# Patient Record
Sex: Female | Born: 1955 | Race: White | Hispanic: No | Marital: Married | Smoking: Former smoker
Health system: Southern US, Community
[De-identification: ages and names within clinical notes are randomized; demographics above are authoritative.]

## PROBLEM LIST (undated history)

## (undated) DIAGNOSIS — R112 Nausea with vomiting, unspecified: Secondary | ICD-10-CM

## (undated) DIAGNOSIS — T4145XA Adverse effect of unspecified anesthetic, initial encounter: Secondary | ICD-10-CM

## (undated) DIAGNOSIS — Z9889 Other specified postprocedural states: Secondary | ICD-10-CM

## (undated) DIAGNOSIS — J302 Other seasonal allergic rhinitis: Secondary | ICD-10-CM

## (undated) DIAGNOSIS — M1990 Unspecified osteoarthritis, unspecified site: Secondary | ICD-10-CM

## (undated) DIAGNOSIS — T8859XA Other complications of anesthesia, initial encounter: Secondary | ICD-10-CM

## (undated) HISTORY — PX: TONSILLECTOMY: SUR1361

## (undated) HISTORY — PX: FINGER TENDON REPAIR: SHX1640

## (undated) HISTORY — PX: BROW LIFT: SHX178

## (undated) HISTORY — PX: FRACTURE SURGERY: SHX138

## (undated) HISTORY — PX: CHOLECYSTECTOMY: SHX55

---

## 2011-04-25 ENCOUNTER — Encounter (HOSPITAL_BASED_OUTPATIENT_CLINIC_OR_DEPARTMENT_OTHER): Payer: Self-pay | Admitting: *Deleted

## 2011-04-25 ENCOUNTER — Other Ambulatory Visit: Payer: Self-pay | Admitting: Orthopedic Surgery

## 2011-05-01 ENCOUNTER — Encounter (HOSPITAL_BASED_OUTPATIENT_CLINIC_OR_DEPARTMENT_OTHER): Payer: Self-pay | Admitting: *Deleted

## 2011-05-01 ENCOUNTER — Ambulatory Visit (HOSPITAL_BASED_OUTPATIENT_CLINIC_OR_DEPARTMENT_OTHER): Payer: BC Managed Care – PPO | Admitting: Anesthesiology

## 2011-05-01 ENCOUNTER — Encounter (HOSPITAL_BASED_OUTPATIENT_CLINIC_OR_DEPARTMENT_OTHER)
Admission: RE | Disposition: A | Discharge status: Home / Self Care | Payer: Self-pay | Source: Ambulatory Visit | Attending: Orthopedic Surgery

## 2011-05-01 ENCOUNTER — Ambulatory Visit (HOSPITAL_BASED_OUTPATIENT_CLINIC_OR_DEPARTMENT_OTHER)
Admission: RE | Admit: 2011-05-01 | Discharge: 2011-05-01 | Disposition: A | Discharge status: Home / Self Care | Payer: BC Managed Care – PPO | Source: Ambulatory Visit | Attending: Orthopedic Surgery | Admitting: Orthopedic Surgery

## 2011-05-01 ENCOUNTER — Encounter (HOSPITAL_BASED_OUTPATIENT_CLINIC_OR_DEPARTMENT_OTHER): Payer: Self-pay | Admitting: Anesthesiology

## 2011-05-01 DIAGNOSIS — S60459A Superficial foreign body of unspecified finger, initial encounter: Secondary | ICD-10-CM | POA: Insufficient documentation

## 2011-05-01 DIAGNOSIS — X58XXXA Exposure to other specified factors, initial encounter: Secondary | ICD-10-CM | POA: Insufficient documentation

## 2011-05-01 DIAGNOSIS — Y929 Unspecified place or not applicable: Secondary | ICD-10-CM | POA: Insufficient documentation

## 2011-05-01 HISTORY — DX: Unspecified osteoarthritis, unspecified site: M19.90

## 2011-05-01 HISTORY — DX: Adverse effect of unspecified anesthetic, initial encounter: T41.45XA

## 2011-05-01 HISTORY — DX: Other seasonal allergic rhinitis: J30.2

## 2011-05-01 HISTORY — PX: MASS EXCISION: SHX2000

## 2011-05-01 HISTORY — DX: Other specified postprocedural states: Z98.890

## 2011-05-01 HISTORY — DX: Nausea with vomiting, unspecified: R11.2

## 2011-05-01 HISTORY — DX: Other complications of anesthesia, initial encounter: T88.59XA

## 2011-05-01 LAB — POCT HEMOGLOBIN-HEMACUE: Hemoglobin: 13.5 g/dL (ref 12.0–15.0)

## 2011-05-01 SURGERY — EXCISION MASS
Anesthesia: General | Site: Finger | Laterality: Left | Wound class: Clean

## 2011-05-01 MED ORDER — EPHEDRINE SULFATE 50 MG/ML IJ SOLN
INTRAMUSCULAR | Status: DC | PRN
  Administered 2011-05-01: 10 mg via INTRAVENOUS

## 2011-05-01 MED ORDER — ONDANSETRON HCL 4 MG/2ML IJ SOLN
INTRAMUSCULAR | Status: DC | PRN
  Administered 2011-05-01: 4 mg via INTRAVENOUS

## 2011-05-01 MED ORDER — CHLORHEXIDINE GLUCONATE 4 % EX LIQD: 60.0000 mL | Freq: Once | CUTANEOUS | Status: DC

## 2011-05-01 MED ORDER — HYDROMORPHONE HCL PF 1 MG/ML IJ SOLN: 0.2500 mg | INTRAMUSCULAR | Status: DC | PRN

## 2011-05-01 MED ORDER — ONDANSETRON HCL 4 MG/2ML IJ SOLN: 4.0000 mg | Freq: Four times a day (QID) | INTRAMUSCULAR | Status: DC | PRN

## 2011-05-01 MED ORDER — LIDOCAINE HCL (CARDIAC) 20 MG/ML IV SOLN
INTRAVENOUS | Status: DC | PRN
  Administered 2011-05-01: 60 mg via INTRAVENOUS

## 2011-05-01 MED ORDER — PROPOFOL 10 MG/ML IV EMUL
INTRAVENOUS | Status: DC | PRN
  Administered 2011-05-01: 200 mg via INTRAVENOUS

## 2011-05-01 MED ORDER — FENTANYL CITRATE 0.05 MG/ML IJ SOLN
INTRAMUSCULAR | Status: DC | PRN
  Administered 2011-05-01: 50 ug via INTRAVENOUS
  Administered 2011-05-01: 25 ug via INTRAVENOUS
  Administered 2011-05-01: 50 ug via INTRAVENOUS

## 2011-05-01 MED ORDER — LACTATED RINGERS IV SOLN
INTRAVENOUS | Status: DC
  Administered 2011-05-01: 09:00:00 via INTRAVENOUS

## 2011-05-01 MED ORDER — DEXAMETHASONE SODIUM PHOSPHATE 10 MG/ML IJ SOLN
INTRAMUSCULAR | Status: DC | PRN
  Administered 2011-05-01: 10 mg via INTRAVENOUS

## 2011-05-01 MED ORDER — HYDROCODONE-ACETAMINOPHEN 5-325 MG PO TABS: ORAL_TABLET | ORAL | Status: AC

## 2011-05-01 MED ORDER — BUPIVACAINE HCL (PF) 0.25 % IJ SOLN
INTRAMUSCULAR | Status: DC | PRN
  Administered 2011-05-01: 10 mL

## 2011-05-01 MED ORDER — MIDAZOLAM HCL 5 MG/5ML IJ SOLN
INTRAMUSCULAR | Status: DC | PRN
  Administered 2011-05-01: 2 mg via INTRAVENOUS

## 2011-05-01 MED ORDER — CEFAZOLIN SODIUM 1-5 GM-% IV SOLN
1.0000 g | INTRAVENOUS | Status: AC
  Administered 2011-05-01: 1 g via INTRAVENOUS

## 2011-05-01 SURGICAL SUPPLY — 49 items
BANDAGE COBAN STERILE 2 (GAUZE/BANDAGES/DRESSINGS) IMPLANT
BANDAGE CONFORM 2  STR LF (GAUZE/BANDAGES/DRESSINGS) IMPLANT
BANDAGE ELASTIC 3 VELCRO ST LF (GAUZE/BANDAGES/DRESSINGS) IMPLANT
BANDAGE GAUZE 4  KLING STR (GAUZE/BANDAGES/DRESSINGS) IMPLANT
BANDAGE GAUZE ELAST BULKY 4 IN (GAUZE/BANDAGES/DRESSINGS) IMPLANT
BANDAGE GAUZE STRT 1 STR LF (GAUZE/BANDAGES/DRESSINGS) IMPLANT
BENZOIN TINCTURE PRP APPL 2/3 (GAUZE/BANDAGES/DRESSINGS) IMPLANT
BLADE MINI RND TIP GREEN BEAV (BLADE) IMPLANT
BLADE SURG 15 STRL LF DISP TIS (BLADE) ×2 IMPLANT
BLADE SURG 15 STRL SS (BLADE) ×2
BNDG COHESIVE 1X5 TAN STRL LF (GAUZE/BANDAGES/DRESSINGS) ×2 IMPLANT
BNDG ELASTIC 2 VLCR STRL LF (GAUZE/BANDAGES/DRESSINGS) IMPLANT
BNDG ESMARK 4X9 LF (GAUZE/BANDAGES/DRESSINGS) IMPLANT
BNDG PLASTER X FAST 3X3 WHT LF (CAST SUPPLIES) IMPLANT
CHLORAPREP W/TINT 26ML (MISCELLANEOUS) ×2 IMPLANT
CLOTH BEACON ORANGE TIMEOUT ST (SAFETY) ×2 IMPLANT
CORDS BIPOLAR (ELECTRODE) ×2 IMPLANT
COVER MAYO STAND STRL (DRAPES) ×2 IMPLANT
COVER TABLE BACK 60X90 (DRAPES) ×2 IMPLANT
CUFF TOURNIQUET SINGLE 18IN (TOURNIQUET CUFF) ×2 IMPLANT
DRAPE EXTREMITY T 121X128X90 (DRAPE) ×2 IMPLANT
DRAPE SURG 17X23 STRL (DRAPES) ×2 IMPLANT
GAUZE XEROFORM 1X8 LF (GAUZE/BANDAGES/DRESSINGS) ×2 IMPLANT
GLOVE BIO SURGEON STRL SZ7.5 (GLOVE) IMPLANT
GLOVE BIOGEL PI IND STRL 6.5 (GLOVE) ×1 IMPLANT
GLOVE BIOGEL PI IND STRL 8 (GLOVE) ×1 IMPLANT
GLOVE BIOGEL PI INDICATOR 6.5 (GLOVE) ×1
GLOVE BIOGEL PI INDICATOR 8 (GLOVE) ×1
GOWN PREVENTION PLUS XLARGE (GOWN DISPOSABLE) ×2 IMPLANT
GOWN STRL REIN XL XLG (GOWN DISPOSABLE) ×2 IMPLANT
NEEDLE HYPO 25X1 1.5 SAFETY (NEEDLE) ×2 IMPLANT
NS IRRIG 1000ML POUR BTL (IV SOLUTION) ×2 IMPLANT
PACK BASIN DAY SURGERY FS (CUSTOM PROCEDURE TRAY) ×2 IMPLANT
PAD CAST 3X4 CTTN HI CHSV (CAST SUPPLIES) IMPLANT
PAD CAST 4YDX4 CTTN HI CHSV (CAST SUPPLIES) IMPLANT
PADDING CAST ABS 4INX4YD NS (CAST SUPPLIES) ×1
PADDING CAST ABS COTTON 4X4 ST (CAST SUPPLIES) ×1 IMPLANT
PADDING CAST COTTON 3X4 STRL (CAST SUPPLIES)
PADDING CAST COTTON 4X4 STRL (CAST SUPPLIES)
SPONGE GAUZE 4X4 12PLY (GAUZE/BANDAGES/DRESSINGS) ×2 IMPLANT
STOCKINETTE 4X48 STRL (DRAPES) ×2 IMPLANT
STRIP CLOSURE SKIN 1/2X4 (GAUZE/BANDAGES/DRESSINGS) ×2 IMPLANT
SUT ETHILON 3 0 PS 1 (SUTURE) IMPLANT
SUT ETHILON 4 0 PS 2 18 (SUTURE) ×2 IMPLANT
SYR BULB 3OZ (MISCELLANEOUS) ×2 IMPLANT
SYR CONTROL 10ML LL (SYRINGE) ×2 IMPLANT
TOWEL OR 17X24 6PK STRL BLUE (TOWEL DISPOSABLE) ×4 IMPLANT
UNDERPAD 30X30 INCONTINENT (UNDERPADS AND DIAPERS) ×2 IMPLANT
WATER STERILE IRR 1000ML POUR (IV SOLUTION) ×2 IMPLANT

## 2011-05-01 NOTE — Op Note (Signed)
Dictation [phone]

## 2011-05-01 NOTE — Anesthesia Preprocedure Evaluation (Signed)
Anesthesia Evaluation  Patient identified by MRN, date of birth, ID band Patient awake    Reviewed: Allergy & Precautions, H&P , NPO status , Patient's Chart, lab work & pertinent test results  History of Anesthesia Complications (+) PONV  Airway Mallampati: II  Neck ROM: full    Dental   Pulmonary former smoker         Cardiovascular     Neuro/Psych    GI/Hepatic   Endo/Other    Renal/GU      Musculoskeletal  (+) Arthritis -,   Abdominal   Peds  Hematology   Anesthesia Other Findings   Reproductive/Obstetrics                           Anesthesia Physical Anesthesia Plan  ASA: II  Anesthesia Plan: General   Post-op Pain Management:    Induction: Intravenous  Airway Management Planned: LMA  Additional Equipment:   Intra-op Plan:   Post-operative Plan:   Informed Consent: I have reviewed the patients History and Physical, chart, labs and discussed the procedure including the risks, benefits and alternatives for the proposed anesthesia with the patient or authorized representative who has indicated his/her understanding and acceptance.     Plan Discussed with: CRNA and Surgeon  Anesthesia Plan Comments:         Anesthesia Quick Evaluation

## 2011-05-01 NOTE — Op Note (Signed)
NAMEVONNA, BRABSON            ACCOUNT NO.:  [id number]  MEDICAL RECORD NO.:  [id number]  LOCATION:                                 FACILITY:  PHYSICIAN:  Betha Loa, MD             DATE OF BIRTH:  DATE OF PROCEDURE:  05/01/2011 DATE OF DISCHARGE:                              OPERATIVE REPORT   PREOPERATIVE DIAGNOSIS:  Left ring finger retained foreign body.  POSTOPERATIVE DIAGNOSIS:  Left ring finger retained foreign body.  PROCEDURE:  Removal of foreign body, left ring finger.  SURGEON:  Betha Loa, M.D.  ASSISTANTS:  None.  ANESTHESIA:  General.  IV FLUIDS:  Per anesthesia flow sheet.  ESTIMATED BLOOD LOSS:  Minimal.  COMPLICATIONS:  None.  SPECIMENS:  None.  TOURNIQUET TIME:  60 minutes.  DISPOSITION:  Stable to PACU.  INDICATIONS:  Ms. Biever is a 56 year old left-hand dominant female who had noticed a bump on the left ring finger on the dorsum over the proximal phalanx for over a month.  This was bothersome to her.  She would like it removed.  On evaluation in the office, she had a palpable and nontender mass on the left ring finger on the dorsal ulnar side of the proximal phalanx.  It was rated dense on x-rays.  I discussed with Ms. Abdelaziz the nature of the mass.  We discussed observation versus removal.  She wished to have it removed.  Risks, benefits, and alternatives of surgery were discussed including risk of blood loss, infection, damage to nerves, vessels, tendons, ligaments, bone, failure of procedure, need for additional procedures, complications, wound healing, continued pain, and retained foreign body.  She voiced understanding of these risks and elected to proceed.  OPERATIVE COURSE:  After being identified preoperatively by myself, the patient and I agreed upon the procedure and site of the procedure.  The surgical site was marked.  The risks, benefits, and alternatives of surgery were reviewed and she wished to proceed.  Surgical  consent had been signed.  She was given 1 g of IV Ancef as preoperative antibiotic prophylaxis.  She was transferred to the operating room and placed on the operative table in supine position with the left upper extremity on arm board.  General anesthesia was induced by the anesthesiologist.  The left upper extremity was prepped and draped in normal sterile orthopedic fashion.  A surgical pause was performed between surgeons, anesthesia, operating staff, and all were in agreement with the patient, procedure, and site of the procedure.  Tourniquet at the proximal aspect of the extremity was inflated to 250 mmHg after exsanguination of the limb with an Esmarch bandage.  Incision was made in a longitudinal fashion over the dorsum of the ring finger.  This carried in to subcutaneous tissues by spreading technique.  There was foreign-body granuloma surrounding the foreign body.  The foreign body was a piece of glass and was removed.  The foreign-body granuloma was removed as well.  C-arm was used in AP and lateral projections to help ensure removal of the foreign body.  There was no radio-dense mass remaining.  The wound was inspected and I did not see any further  evidence of any foreign body.  It was copiously irrigated with sterile saline.  Skin was closed with 5-0 Monocryl in a running subcuticular fashion.  It was augmented with Steri- Strips.  A digital block was performed with 10 mL of 0.25% plain Marcaine to aid in postoperative analgesia.  The wound was dressed with sterile 4x4s and wrapped with Kling and a Coban dressing lightly. Tourniquet was deflated at 16 minutes.  The fingertips were pink with brisk capillary refill after deflation of tourniquet.  Operative drapes were broken down.  The patient was awoken from anesthesia safely.  She was transferred back to stretcher and taken to PACU in stable condition. I will see her back in the office in 1 week for postoperative followup. I  will give her Norco 5/325, 1-2 p.o. q.6 hours p.r.n. pain, dispensed #40.     Betha Loa, MD     KK/MEDQ  D:  05/01/2011  T:  05/01/2011  Job:  [id number]

## 2011-05-01 NOTE — Anesthesia Postprocedure Evaluation (Signed)
Anesthesia Post Note  Patient: April Davies  Procedure(s) Performed: Procedure(s) (LRB): EXCISION MASS (Left)  Anesthesia type: General  Patient location: PACU  Post pain: Pain level controlled and Adequate analgesia  Post assessment: Post-op Vital signs reviewed, Patient's Cardiovascular Status Stable, Respiratory Function Stable, Patent Airway and Pain level controlled  Last Vitals:  Filed Vitals:   05/01/11 1039  BP:   Pulse: 97  Temp: 36.3 C  Resp:     Post vital signs: Reviewed and stable  Level of consciousness: awake, alert  and oriented  Complications: No apparent anesthesia complications

## 2011-05-01 NOTE — Transfer of Care (Signed)
Immediate Anesthesia Transfer of Care Note  Patient: April Davies  Procedure(s) Performed: Procedure(s) (LRB): EXCISION MASS (Left)  Patient Location: PACU  Anesthesia Type: General  Level of Consciousness: awake, alert , oriented and patient cooperative  Airway & Oxygen Therapy: Patient Spontanous Breathing and Patient connected to face mask oxygen  Post-op Assessment: Report given to PACU RN and Post -op Vital signs reviewed and stable  Post vital signs: Reviewed and stable  Complications: No apparent anesthesia complications

## 2011-05-01 NOTE — Discharge Instructions (Addendum)
[facility] Instructions Hand Surgery  Wound Care: Keep your hand elevated above the level of your heart.  Do not allow it to dangle  by your side.  Keep the dressing dry and do not remove it unless your doctor advises you to do so.  He will usually change it at the time of your post-op visit.  Moving your fingers is advised to stimulate circulation but will depend on the site of your surgery.  If you have a splint applied, your doctor will advise you regarding movement.  Activity: Do not drive or operate machinery today.  Rest today and then you may return to your normal activity and work as indicated by your physician.  Diet:  Drink liquids today or eat a light diet.  You may resume a regular diet tomorrow.    General expectations: Pain for two to three days. Fingers may become slightly swollen.  Call your doctor if any of the following occur: Severe pain not relieved by pain medication. Elevated temperature. Dressing soaked with blood. Inability to move fingers. White or bluish color to fingers.[facility]  [address] [place], [place] [address] [phone]   Post Anesthesia Home Care Instructions  Activity: Get plenty of rest for the remainder of the day. A responsible adult should stay with you for 24 hours following the procedure.  For the next 24 hours, DO NOT: -Drive a car -Operate machinery -Drink alcoholic beverages -Take any medication unless instructed by your physician -Make any legal decisions or sign important papers.  Meals: Start with liquid foods such as gelatin or soup. Progress to regular foods as tolerated. Avoid greasy, spicy, heavy foods. If nausea and/or vomiting occur, drink only clear liquids until the nausea and/or vomiting subsides. Call your physician if vomiting continues.  Special Instructions/Symptoms: Your throat may feel dry or sore from the anesthesia or the breathing tube placed in your throat during surgery. If  this causes discomfort, gargle with warm salt water. The discomfort should disappear within 24 hours.   

## 2011-05-01 NOTE — Anesthesia Procedure Notes (Signed)
Procedure Name: LMA Insertion Date/Time: 05/01/2011 10:05 AM Performed by: Yanin Muhlestein D Pre-anesthesia Checklist: Patient identified, Emergency Drugs available, Suction available and Patient being monitored Patient Re-evaluated:Patient Re-evaluated prior to inductionOxygen Delivery Method: Circle System Utilized Preoxygenation: Pre-oxygenation with 100% oxygen Intubation Type: IV induction Ventilation: Mask ventilation without difficulty LMA: LMA inserted LMA Size: 4.0 Number of attempts: 1 Placement Confirmation: positive ETCO2 Tube secured with: Tape Dental Injury: Teeth and Oropharynx as per pre-operative assessment
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
## 2011-05-01 NOTE — Brief Op Note (Signed)
05/01/2011  10:34 AM  PATIENT:  April Davies  56 y.o. female  PRE-OPERATIVE DIAGNOSIS:  left ring finger mass  POST-OPERATIVE DIAGNOSIS:  foreign body left ring finger  PROCEDURE:  Procedure(s) (LRB): EXCISION MASS (Left)  SURGEON:  Surgeon(s) and Role:    * Tami Ribas, MD - Primary  PHYSICIAN ASSISTANT:   ASSISTANTS: none   ANESTHESIA:   general  EBL:  Total I/O In: 700 [I.V.:700] Out: -   BLOOD ADMINISTERED:none  DRAINS: none   LOCAL MEDICATIONS USED:  MARCAINE     SPECIMEN:  No Specimen  DISPOSITION OF SPECIMEN:  N/A  COUNTS:  YES  TOURNIQUET:   Total Tourniquet Time Documented: Upper Arm (Left) - 16 minutes  DICTATION: .Other Dictation: Dictation Number [id number]  PLAN OF CARE: Discharge to home after PACU  PATIENT DISPOSITION:  PACU - hemodynamically stable.

## 2011-05-04 ENCOUNTER — Encounter (HOSPITAL_BASED_OUTPATIENT_CLINIC_OR_DEPARTMENT_OTHER): Payer: Self-pay | Admitting: Orthopedic Surgery

## 2011-05-23 ENCOUNTER — Other Ambulatory Visit (HOSPITAL_COMMUNITY)
Admission: RE | Admit: 2011-05-23 | Discharge: 2011-05-23 | Disposition: A | Discharge status: Home / Self Care | Payer: BC Managed Care – PPO | Source: Ambulatory Visit | Attending: Family Medicine | Admitting: Family Medicine

## 2011-05-23 DIAGNOSIS — Z0000 Encounter for general adult medical examination without abnormal findings: Secondary | ICD-10-CM | POA: Insufficient documentation

## 2012-02-19 ENCOUNTER — Other Ambulatory Visit: Payer: Self-pay | Admitting: Family Medicine

## 2012-02-19 DIAGNOSIS — Z1231 Encounter for screening mammogram for malignant neoplasm of breast: Secondary | ICD-10-CM

## 2012-03-22 ENCOUNTER — Ambulatory Visit
Admission: RE | Admit: 2012-03-22 | Discharge: 2012-03-22 | Disposition: A | Discharge status: Home / Self Care | Payer: BC Managed Care – PPO | Source: Ambulatory Visit | Attending: Family Medicine | Admitting: Family Medicine

## 2012-03-22 DIAGNOSIS — Z1231 Encounter for screening mammogram for malignant neoplasm of breast: Secondary | ICD-10-CM

## 2012-03-25 ENCOUNTER — Other Ambulatory Visit: Payer: Self-pay | Admitting: Family Medicine

## 2012-03-25 DIAGNOSIS — R928 Other abnormal and inconclusive findings on diagnostic imaging of breast: Secondary | ICD-10-CM

## 2012-03-29 ENCOUNTER — Other Ambulatory Visit: Payer: Self-pay

## 2012-04-01 ENCOUNTER — Ambulatory Visit
Admission: RE | Admit: 2012-04-01 | Discharge: 2012-04-01 | Disposition: A | Discharge status: Home / Self Care | Payer: BC Managed Care – PPO | Source: Ambulatory Visit | Attending: Family Medicine | Admitting: Family Medicine

## 2012-04-01 DIAGNOSIS — R928 Other abnormal and inconclusive findings on diagnostic imaging of breast: Secondary | ICD-10-CM

## 2013-04-25 ENCOUNTER — Ambulatory Visit
Admission: RE | Admit: 2013-04-25 | Discharge: 2013-04-25 | Disposition: A | Discharge status: Home / Self Care | Payer: BC Managed Care – PPO | Source: Ambulatory Visit | Attending: Family Medicine | Admitting: Family Medicine

## 2013-04-25 ENCOUNTER — Other Ambulatory Visit: Payer: Self-pay | Admitting: Family Medicine

## 2013-04-25 DIAGNOSIS — S60229A Contusion of unspecified hand, initial encounter: Secondary | ICD-10-CM

## 2013-06-19 ENCOUNTER — Other Ambulatory Visit (HOSPITAL_COMMUNITY)
Admission: RE | Admit: 2013-06-19 | Discharge: 2013-06-19 | Disposition: A | Discharge status: Home / Self Care | Payer: BC Managed Care – PPO | Source: Ambulatory Visit | Attending: Family Medicine | Admitting: Family Medicine

## 2013-06-19 ENCOUNTER — Other Ambulatory Visit: Payer: Self-pay | Admitting: Family Medicine

## 2013-06-19 DIAGNOSIS — Z0000 Encounter for general adult medical examination without abnormal findings: Secondary | ICD-10-CM | POA: Insufficient documentation

## 2013-09-15 ENCOUNTER — Other Ambulatory Visit: Payer: Self-pay | Admitting: Family Medicine

## 2013-09-15 ENCOUNTER — Ambulatory Visit
Admission: RE | Admit: 2013-09-15 | Discharge: 2013-09-15 | Disposition: A | Discharge status: Home / Self Care | Payer: BC Managed Care – PPO | Source: Ambulatory Visit | Attending: Family Medicine | Admitting: Family Medicine

## 2013-09-15 DIAGNOSIS — R55 Syncope and collapse: Secondary | ICD-10-CM

## 2013-09-16 ENCOUNTER — Other Ambulatory Visit: Payer: Self-pay | Admitting: Family Medicine

## 2013-09-16 DIAGNOSIS — R930 Abnormal findings on diagnostic imaging of skull and head, not elsewhere classified: Secondary | ICD-10-CM

## 2013-09-18 ENCOUNTER — Encounter (INDEPENDENT_AMBULATORY_CARE_PROVIDER_SITE_OTHER): Payer: Self-pay

## 2013-09-18 ENCOUNTER — Ambulatory Visit
Admission: RE | Admit: 2013-09-18 | Discharge: 2013-09-18 | Disposition: A | Discharge status: Home / Self Care | Payer: BC Managed Care – PPO | Source: Ambulatory Visit | Attending: Family Medicine | Admitting: Family Medicine

## 2013-09-18 DIAGNOSIS — R930 Abnormal findings on diagnostic imaging of skull and head, not elsewhere classified: Secondary | ICD-10-CM

## 2013-09-18 MED ORDER — IOHEXOL 350 MG/ML SOLN
80.0000 mL | Freq: Once | INTRAVENOUS | Status: AC | PRN
  Administered 2013-09-18: 80 mL via INTRAVENOUS

## 2015-06-09 ENCOUNTER — Other Ambulatory Visit: Payer: Self-pay

## 2015-06-09 DIAGNOSIS — Z1231 Encounter for screening mammogram for malignant neoplasm of breast: Secondary | ICD-10-CM

## 2015-06-09 DIAGNOSIS — Z803 Family history of malignant neoplasm of breast: Secondary | ICD-10-CM

## 2015-06-25 ENCOUNTER — Ambulatory Visit: Payer: Self-pay

## 2015-07-09 ENCOUNTER — Ambulatory Visit
Admission: RE | Admit: 2015-07-09 | Discharge: 2015-07-09 | Disposition: A | Discharge status: Home / Self Care | Payer: PRIVATE HEALTH INSURANCE | Source: Ambulatory Visit

## 2015-07-09 DIAGNOSIS — Z803 Family history of malignant neoplasm of breast: Secondary | ICD-10-CM

## 2015-07-09 DIAGNOSIS — Z1231 Encounter for screening mammogram for malignant neoplasm of breast: Secondary | ICD-10-CM

## 2015-07-15 ENCOUNTER — Other Ambulatory Visit: Payer: Self-pay | Admitting: Family Medicine

## 2015-07-15 DIAGNOSIS — R928 Other abnormal and inconclusive findings on diagnostic imaging of breast: Secondary | ICD-10-CM

## 2015-07-21 ENCOUNTER — Ambulatory Visit
Admission: RE | Admit: 2015-07-21 | Discharge: 2015-07-21 | Disposition: A | Discharge status: Home / Self Care | Payer: PRIVATE HEALTH INSURANCE | Source: Ambulatory Visit | Attending: Family Medicine | Admitting: Family Medicine

## 2015-07-21 DIAGNOSIS — R928 Other abnormal and inconclusive findings on diagnostic imaging of breast: Secondary | ICD-10-CM
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
